# Patient Record
Sex: Male | Born: 1990
Health system: Southern US, Community
[De-identification: ages and names within clinical notes are randomized; demographics above are authoritative.]

---
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
## 2012-06-06 ENCOUNTER — Encounter (HOSPITAL_COMMUNITY): Payer: Self-pay | Admitting: Emergency Medicine

## 2012-06-06 ENCOUNTER — Emergency Department (HOSPITAL_COMMUNITY)
Admission: EM | Admit: 2012-06-06 | Discharge: 2012-06-07 | Disposition: A | Discharge status: Home / Self Care | Payer: 59 | Attending: Emergency Medicine | Admitting: Emergency Medicine

## 2012-06-06 DIAGNOSIS — Y939 Activity, unspecified: Secondary | ICD-10-CM | POA: Insufficient documentation

## 2012-06-06 DIAGNOSIS — Y92009 Unspecified place in unspecified non-institutional (private) residence as the place of occurrence of the external cause: Secondary | ICD-10-CM | POA: Insufficient documentation

## 2012-06-06 DIAGNOSIS — W268XXA Contact with other sharp object(s), not elsewhere classified, initial encounter: Secondary | ICD-10-CM | POA: Insufficient documentation

## 2012-06-06 DIAGNOSIS — S41111A Laceration without foreign body of right upper arm, initial encounter: Secondary | ICD-10-CM

## 2012-06-06 DIAGNOSIS — R000 Tachycardia, unspecified: Secondary | ICD-10-CM | POA: Insufficient documentation

## 2012-06-06 DIAGNOSIS — Z23 Encounter for immunization: Secondary | ICD-10-CM | POA: Insufficient documentation

## 2012-06-06 DIAGNOSIS — S41109A Unspecified open wound of unspecified upper arm, initial encounter: Secondary | ICD-10-CM | POA: Insufficient documentation

## 2012-06-06 NOTE — ED Provider Notes (Signed)
History    This chart was scribed for non-physician practitioner working with Olivia Mackie, MD by Leone Payor, ED Scribe. This patient was seen in room TR09C/TR09C and the patient's care was started at 2111.    CSN: [id number]  Arrival date & time 06/06/12  2111   First MD Initiated Contact with Patient 06/06/12 2256      Chief Complaint  Patient presents with  . Laceration     Patient is a 22 y.o. male presenting with skin laceration. The history is provided by the patient and medical records. No language interpreter was used.  Laceration Location:  Shoulder/arm Shoulder/arm laceration location:  R upper arm Length (cm):  7 Depth:  Through underlying tissue Quality: straight   Bleeding: controlled   Time since incident:  2 hours Laceration mechanism:  Broken glass Pain details:    Quality:  Aching   Severity:  Mild   Timing:  Constant   Progression:  Unchanged Foreign body present:  No foreign bodies Relieved by:  Pressure Worsened by:  Nothing tried Tetanus status:  Out of date   KIAM BRANSFIELD is a 22 y.o. male who presents to the Emergency Department complaining of new laceration to the R biceps that was sustained from a glass mirror earlier this evening. Pt's last tetanus was greater than 10 years ago. Patient states the near fell and broke cutting his arm.  Laceration occurred approximately 2 hours prior to arrival, hemostasis achieved.  Patient has associated pain at the site of a laceration and bleeding.  Nothing makes it better and nothing makes it worse.  Patient applied pressure at home but did not take any medications.  Pt has a history of bipolar disorder but does not take medications. Pt denies smoking and alcohol use.    History reviewed. No pertinent past medical history.  History reviewed. No pertinent past surgical history.  No family history on file.  History  Substance Use Topics  . Smoking status: Never Smoker   . Smokeless tobacco: Not on file   . Alcohol Use: No      Review of Systems  Constitutional: Negative for fever.  HENT: Negative for mouth sores and neck stiffness.   Eyes: Negative for visual disturbance.  Respiratory: Negative for shortness of breath.   Cardiovascular: Negative for chest pain.  Gastrointestinal: Negative for nausea and vomiting.  Skin: Positive for wound. Negative for rash.  Allergic/Immunologic: Negative for immunocompromised state.  Neurological: Negative for syncope, light-headedness and headaches.  Hematological: Does not bruise/bleed easily.  Psychiatric/Behavioral: The patient is not nervous/anxious.     Allergies  Review of patient's allergies indicates no known allergies.  Home Medications   Current Outpatient Rx  Name  Route  Sig  Dispense  Refill  . Chlorpheniramine Maleate (ALLERGY PO)   Oral   Take 1 tablet by mouth daily as needed (allergies).           BP 145/77  Pulse 106  Temp(Src) 99.1 F (37.3 C) (Oral)  Resp 14  SpO2 99%  Physical Exam  Nursing note and vitals reviewed. Constitutional: He is oriented to person, place, and time. He appears well-developed and well-nourished. No distress.  HENT:  Head: Normocephalic and atraumatic.  Eyes: Conjunctivae are normal. No scleral icterus.  Neck: Normal range of motion.  Cardiovascular: Regular rhythm, normal heart sounds and intact distal pulses.  Tachycardia present.   No murmur heard. Pulses:      Radial pulses are 2+ on the right side,  and 2+ on the left side.  Capillary refill < 3 sec  Pulmonary/Chest: Effort normal and breath sounds normal. No respiratory distress. He has no wheezes. He has no rales.  Musculoskeletal: Normal range of motion. He exhibits no edema.  Full ROM of the RUE  Neurological: He is alert and oriented to person, place, and time. He exhibits normal muscle tone. Coordination normal.  Sensation intact Strength 5/5 in the RUE  Skin: Skin is warm and dry. He is not diaphoretic.  7 cm  laceration to the right upper arm, no foreign bodies seen or palpated  Psychiatric: He has a normal mood and affect.    ED Course  LACERATION REPAIR Date/Time: 06/07/2012 12:08 AM Performed by: Dierdre Forth Authorized by: Dierdre Forth Consent: Verbal consent obtained. Risks and benefits: risks, benefits and alternatives were discussed Consent given by: patient and parent Patient understanding: patient states understanding of the procedure being performed Patient consent: the patient's understanding of the procedure matches consent given Procedure consent: procedure consent matches procedure scheduled Relevant documents: relevant documents present and verified Site marked: the operative site was marked Required items: required blood products, implants, devices, and special equipment available Patient identity confirmed: verbally with patient and arm band Time out: Immediately prior to procedure a "time out" was called to verify the correct patient, procedure, equipment, support staff and site/side marked as required. Body area: upper extremity Location details: right upper arm Laceration length: 7 cm Foreign bodies: no foreign bodies Tendon involvement: none Nerve involvement: none Vascular damage: no Anesthesia: local infiltration Local anesthetic: lidocaine 1% with epinephrine Anesthetic total: 7 ml Patient sedated: no Preparation: Patient was prepped and draped in the usual sterile fashion. Irrigation solution: saline Irrigation method: syringe Amount of cleaning: extensive Debridement: none Degree of undermining: none Skin closure: 3-0 Prolene Number of sutures: 5 Technique: horizontal mattress Approximation: close Approximation difficulty: complex Dressing: 4x4 sterile gauze Patient tolerance: Patient tolerated the procedure well with no immediate complications.   (including critical care time)  DIAGNOSTIC STUDIES: Oxygen Saturation is 99% on room  air, normal by my interpretation.    COORDINATION OF CARE: 12:09 AM-Discussed treatment plan with pt at bedside and pt agreed to plan.    Labs Reviewed - No data to display No results found.   1. Laceration of right upper arm without foreign body, initial encounter       MDM  Angela Burke presents with laceration.  Tdap booster given.Pressure irrigation performed. Laceration occurred < 8 hours prior to repair which was well tolerated. Pt has no co morbidities to effect normal wound healing. Discussed suture home care w pt and answered questions. Pt to f-u for wound check and suture removal in 7 days. Pt is hemodynamically stable w no complaints prior to dc.     I personally performed the services described in this documentation, which was scribed in my presence. The recorded information has been reviewed and is accurate.   Dahlia Client Gwendolyn Mclees, PA-C 06/07/12 0009

## 2012-06-06 NOTE — ED Notes (Signed)
PT. PRESENTS WITH LACERATION APPROX. 3 INCHES AT RIGHT BICEPS SUSTAINED FROM A GLASS MIRROR THIS EVENING , DRESSING APPLIED /INTACT PRIOR TO ARRIVAL.  LAST TETANUS IMMUNIZATION > 10 YEARS AGO.

## 2012-06-07 MED ORDER — TETANUS-DIPHTH-ACELL PERTUSSIS 5-2.5-18.5 LF-MCG/0.5 IM SUSP
0.5000 mL | Freq: Once | INTRAMUSCULAR | Status: AC
  Administered 2012-06-07: 0.5 mL via INTRAMUSCULAR
  Filled 2012-06-07: qty 0.5

## 2012-06-07 NOTE — ED Provider Notes (Signed)
Medical screening examination/treatment/procedure(s) were performed by non-physician practitioner and as supervising physician I was immediately available for consultation/collaboration.  Olivia Mackie, MD 06/07/12 [phone]

## 2015-04-30 DIAGNOSIS — H52221 Regular astigmatism, right eye: Secondary | ICD-10-CM | POA: Diagnosis not present

## 2015-04-30 DIAGNOSIS — H5213 Myopia, bilateral: Secondary | ICD-10-CM | POA: Diagnosis not present

## 2015-10-28 DIAGNOSIS — H10413 Chronic giant papillary conjunctivitis, bilateral: Secondary | ICD-10-CM | POA: Diagnosis not present

## 2015-10-29 MED FILL — LOTEMAX 0.5% EYE DROPS: 0.5 | 14 days supply | Qty: 5 | Fill #0

## 2017-05-23 ENCOUNTER — Other Ambulatory Visit: Payer: Self-pay | Admitting: Family Medicine

## 2017-05-23 DIAGNOSIS — Z0000 Encounter for general adult medical examination without abnormal findings: Secondary | ICD-10-CM | POA: Diagnosis not present

## 2017-05-23 DIAGNOSIS — E041 Nontoxic single thyroid nodule: Secondary | ICD-10-CM | POA: Diagnosis not present

## 2017-05-23 DIAGNOSIS — Z111 Encounter for screening for respiratory tuberculosis: Secondary | ICD-10-CM | POA: Diagnosis not present

## 2017-05-23 DIAGNOSIS — Z23 Encounter for immunization: Secondary | ICD-10-CM | POA: Diagnosis not present

## 2017-05-29 ENCOUNTER — Ambulatory Visit
Admission: RE | Admit: 2017-05-29 | Discharge: 2017-05-29 | Disposition: A | Discharge status: Home / Self Care | Payer: 59 | Source: Ambulatory Visit | Attending: Family Medicine | Admitting: Family Medicine

## 2017-05-29 DIAGNOSIS — E041 Nontoxic single thyroid nodule: Secondary | ICD-10-CM

## 2017-05-29 DIAGNOSIS — E042 Nontoxic multinodular goiter: Secondary | ICD-10-CM | POA: Diagnosis not present

## 2017-08-16 DIAGNOSIS — Z23 Encounter for immunization: Secondary | ICD-10-CM | POA: Diagnosis not present

## 2017-10-03 DIAGNOSIS — H60331 Swimmer's ear, right ear: Secondary | ICD-10-CM | POA: Diagnosis not present

## 2017-10-03 DIAGNOSIS — H6121 Impacted cerumen, right ear: Secondary | ICD-10-CM | POA: Diagnosis not present

## 2017-11-22 DIAGNOSIS — H5213 Myopia, bilateral: Secondary | ICD-10-CM | POA: Diagnosis not present

## 2018-02-15 ENCOUNTER — Telehealth: Payer: 59 | Admitting: Family

## 2018-02-15 DIAGNOSIS — J208 Acute bronchitis due to other specified organisms: Secondary | ICD-10-CM

## 2018-02-15 MED ORDER — BENZONATATE 100 MG PO CAPS: 100.0000 mg | ORAL_CAPSULE | Freq: Three times a day (TID) | ORAL | 0 refills | Status: AC | PRN

## 2018-02-15 MED FILL — BENZONATATE 100 MG CAP: 100 | 5 days supply | Qty: 30 | Fill #0

## 2018-02-15 NOTE — Progress Notes (Signed)
Thank you for the details you included in the comment boxes. Those details are very helpful in determining the best course of treatment for you and help us to provide the best care.  We are sorry that you are not feeling well.  Here is how we plan to help!  Based on your presentation I believe you most likely have A cough due to a virus.  This is called viral bronchitis and is best treated by rest, plenty of fluids and control of the cough.  You may use Ibuprofen or Tylenol as directed to help your symptoms.     In addition you may use A non-prescription cough medication called Mucinex DM: take 2 tablets every 12 hours. and A prescription cough medication called Tessalon Perles 100mg. You may take 1-2 capsules every 8 hours as needed for your cough.    From your responses in the eVisit questionnaire you describe inflammation in the upper respiratory tract which is causing a significant cough.  This is commonly called Bronchitis and has four common causes:    Allergies  Viral Infections  Acid Reflux  Bacterial Infection Allergies, viruses and acid reflux are treated by controlling symptoms or eliminating the cause. An example might be a cough caused by taking certain blood pressure medications. You stop the cough by changing the medication. Another example might be a cough caused by acid reflux. Controlling the reflux helps control the cough.  USE OF BRONCHODILATOR ("RESCUE") INHALERS: There is a risk from using your bronchodilator too frequently.  The risk is that over-reliance on a medication which only relaxes the muscles surrounding the breathing tubes can reduce the effectiveness of medications prescribed to reduce swelling and congestion of the tubes themselves.  Although you feel brief relief from the bronchodilator inhaler, your asthma may actually be worsening with the tubes becoming more swollen and filled with mucus.  This can delay other crucial treatments, such as oral steroid  medications. If you need to use a bronchodilator inhaler daily, several times per day, you should discuss this with your provider.  There are probably better treatments that could be used to keep your asthma under control.     HOME CARE . Only take medications as instructed by your medical team. . Complete the entire course of an antibiotic. . Drink plenty of fluids and get plenty of rest. . Avoid close contacts especially the very young and the elderly . Cover your mouth if you cough or cough into your sleeve. . Always remember to wash your hands . A steam or ultrasonic humidifier can help congestion.   GET HELP RIGHT AWAY IF: . You develop worsening fever. . You become short of breath . You cough up blood. . Your symptoms persist after you have completed your treatment plan MAKE SURE YOU   Understand these instructions.  Will watch your condition.  Will get help right away if you are not doing well or get worse.  Your e-visit answers were reviewed by a board certified advanced clinical practitioner to complete your personal care plan.  Depending on the condition, your plan could have included both over the counter or prescription medications. If there is a problem please reply  once you have received a response from your provider. Your safety is important to us.  If you have drug allergies check your prescription carefully.    You can use MyChart to ask questions about today's visit, request a non-urgent call back, or ask for a work or school excuse   for 24 hours related to this e-Visit. If it has been greater than 24 hours you will need to follow up with your provider, or enter a new e-Visit to address those concerns. You will get an e-mail in the next two days asking about your experience.  I hope that your e-visit has been valuable and will speed your recovery. Thank you for using e-visits.   

## 2018-10-26 DIAGNOSIS — Z03818 Encounter for observation for suspected exposure to other biological agents ruled out: Secondary | ICD-10-CM | POA: Diagnosis not present

## 2019-12-28 IMAGING — US US THYROID
1 series · 14 of 25 positions shown · non-contrast
Comparison: None.

CLINICAL DATA: Thyroid nodules by exam

EXAM:
THYROID ULTRASOUND
TECHNIQUE: Ultrasound examination of the thyroid gland and adjacent soft
tissues was performed.

[Series 1: us thyroid · 0.05mm/px · 14 of 38 slices shown]
[im 1/38]
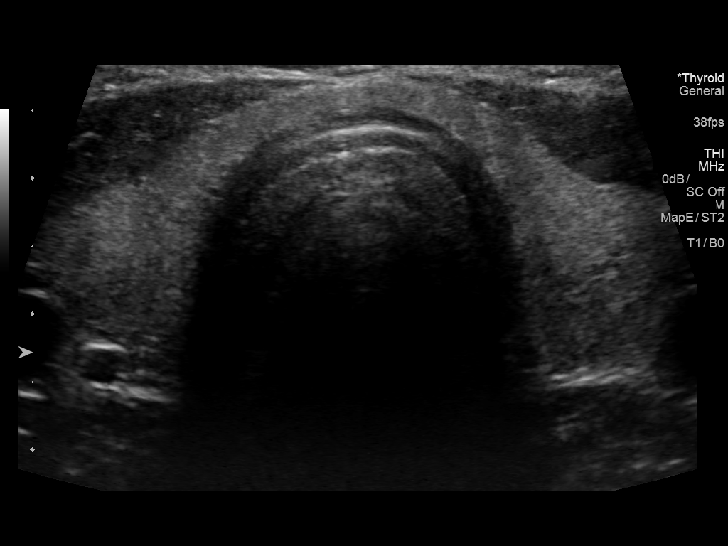
[im 4/38]
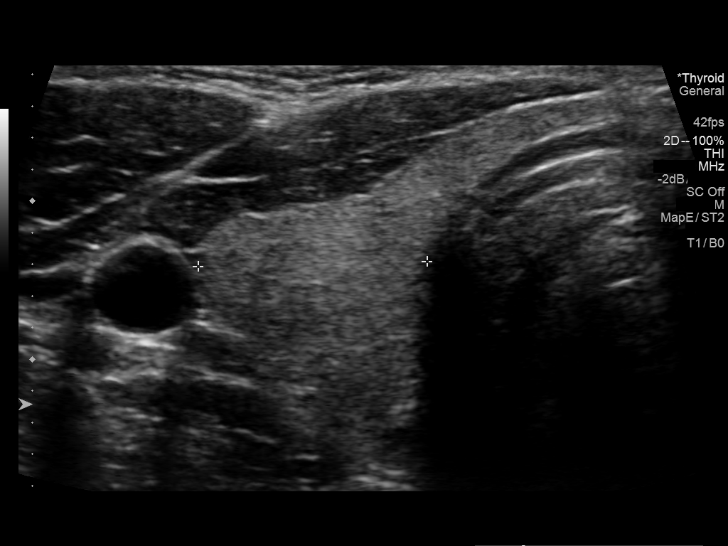
[im 7/38]
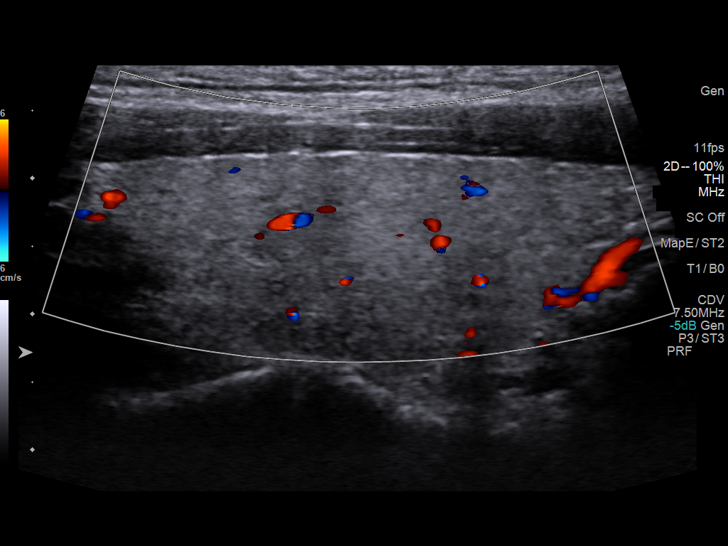
[im 10/38]
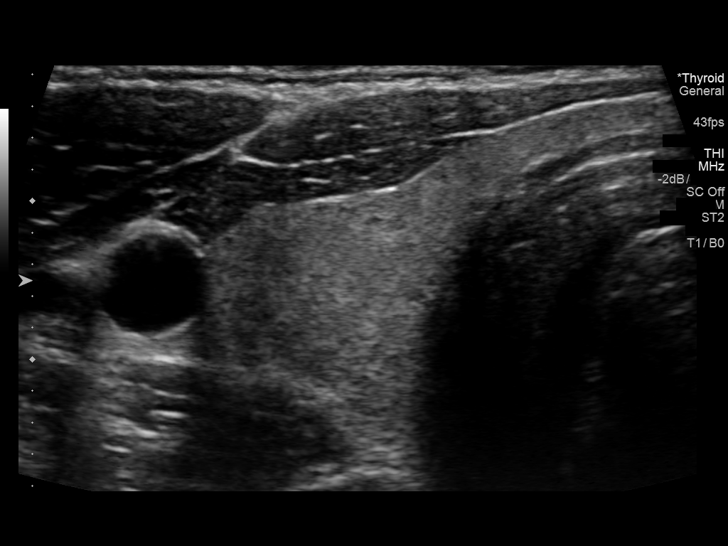
[im 13/38]
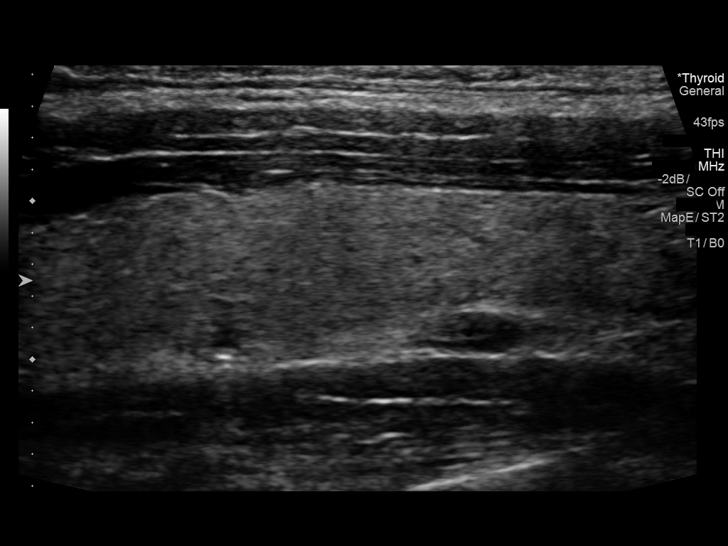
[im 14/38]
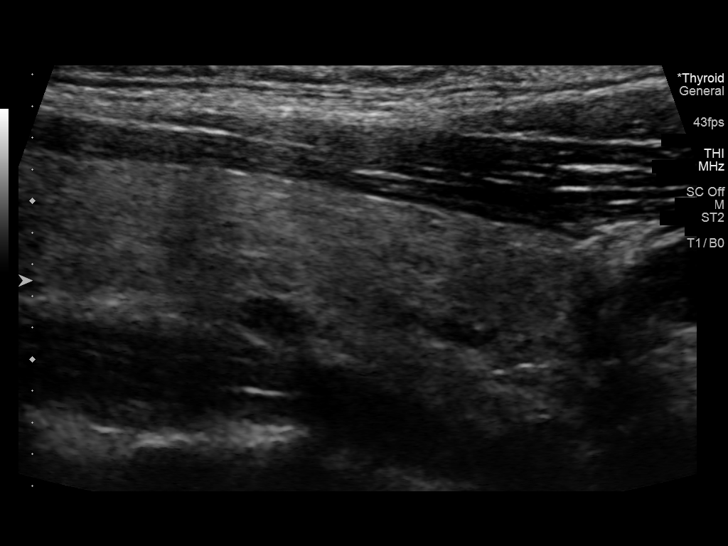
[im 17/38]
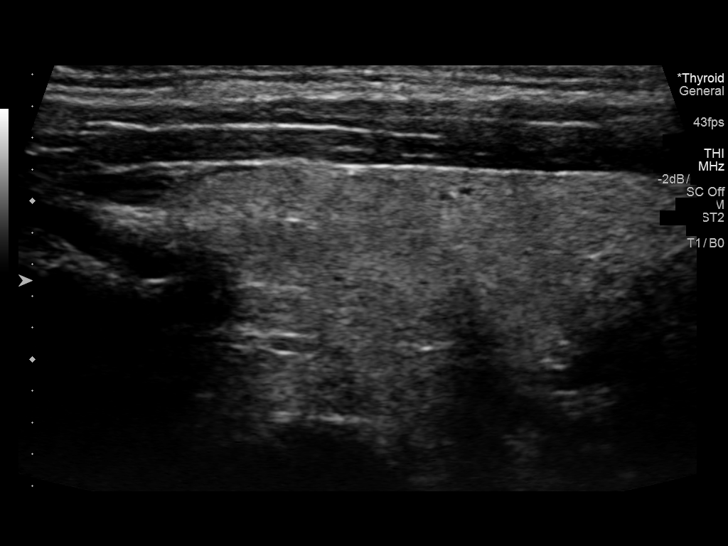
[im 21/38]
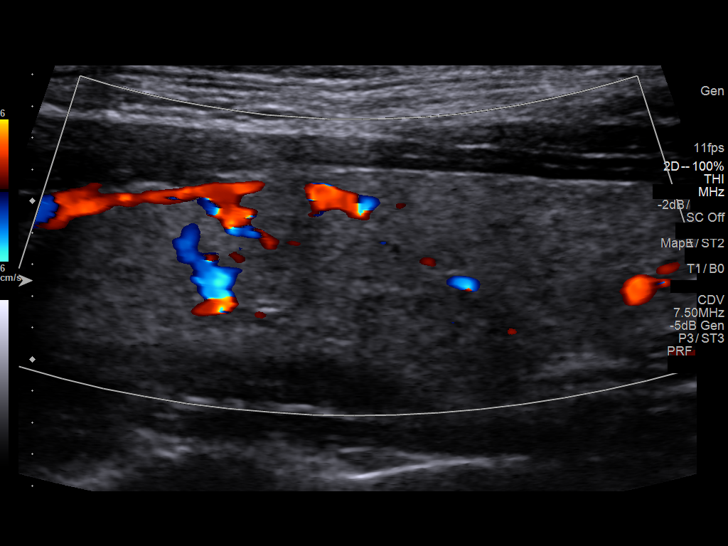
[im 24/38]
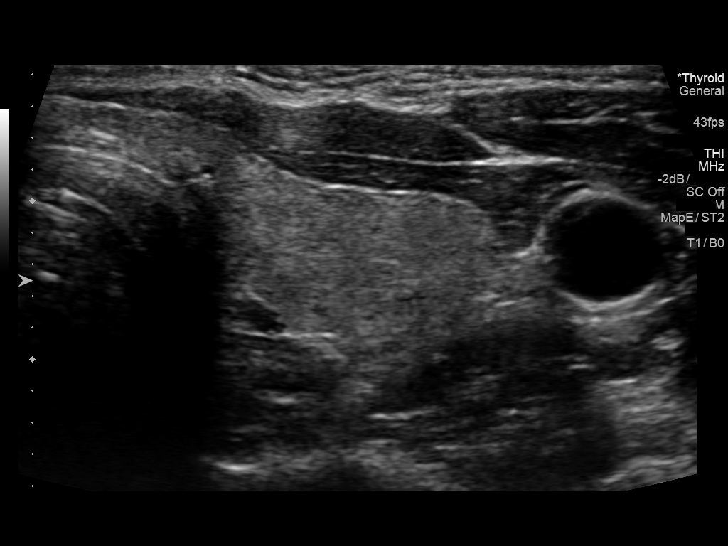
[im 25/38]
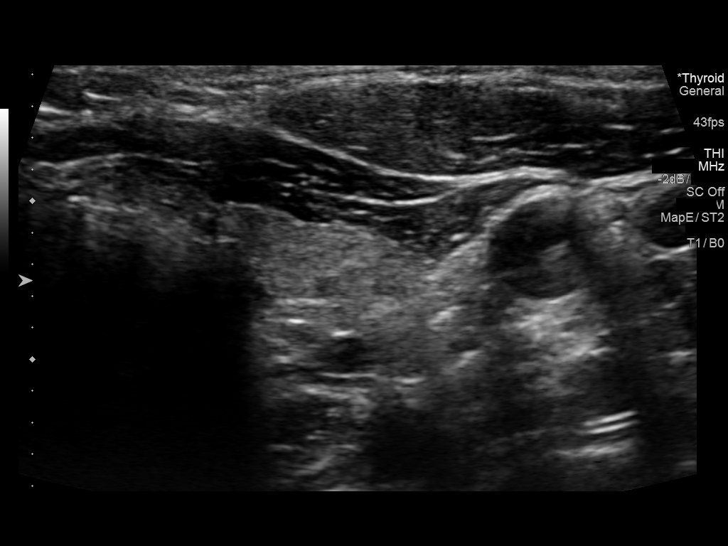
[im 28/38]
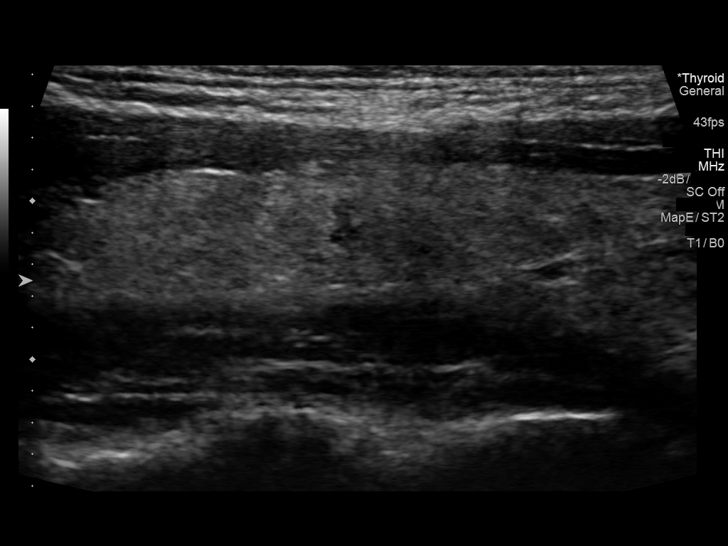
[im 31/38]
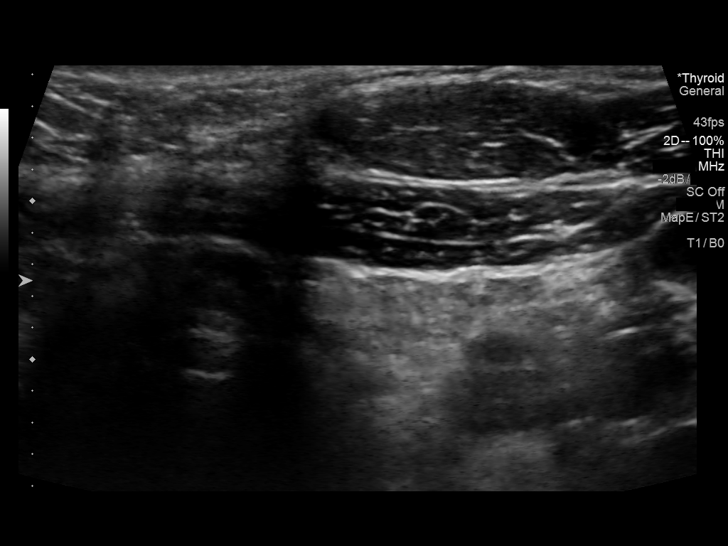
[im 34/38]
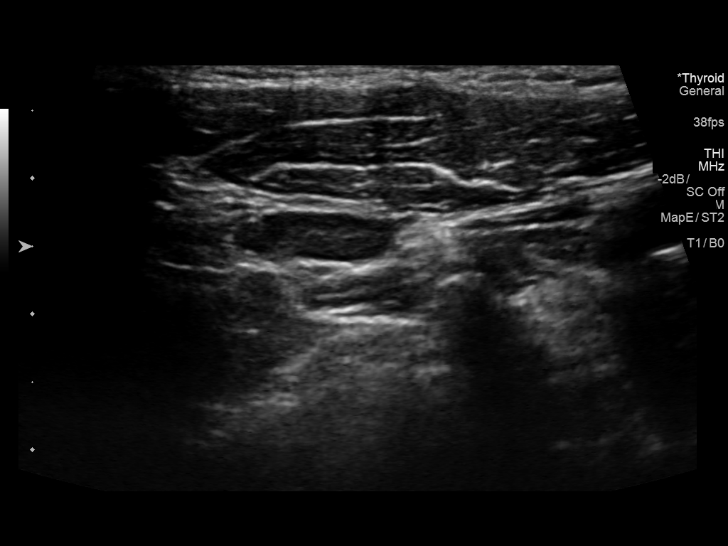
[im 38/38]
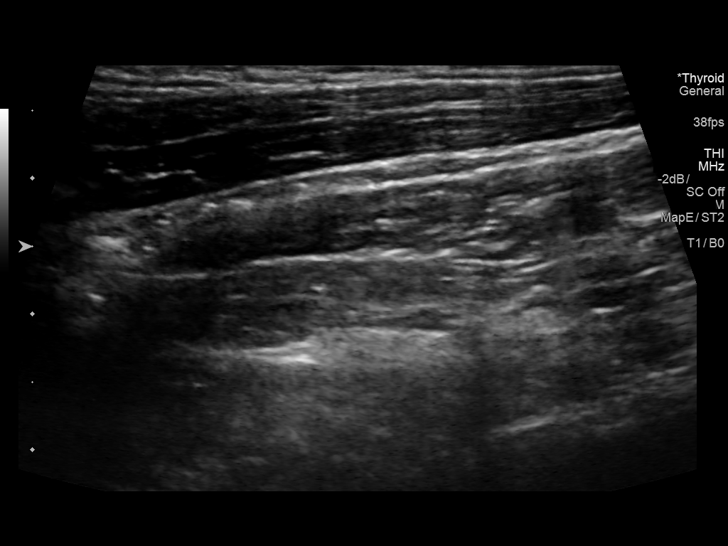

[14 of 25 positions shown; findings below may reference images not displayed]

FINDINGS: Parenchymal Echotexture: Normal

Isthmus: 2 mm

Right lobe: 5.9 x 1.3 x 1.5 cm

Left lobe:  5.2 x 1.3 x 1.8 cm

_________________________________________________________

Estimated total number of nodules >/= 1 cm: 0

Number of spongiform nodules >/=  2 cm not described below (TR1): 0

Number of mixed cystic and solid nodules >/= 1.5 cm not described
below (TR2): 0

_________________________________________________________

No discrete nodules are seen within the thyroid gland.
IMPRESSION: Normal thyroid ultrasound

The above is in keeping with the ACR TI-RADS recommendations - [HOSPITAL] 0198;[DATE].
